# Patient Record
Sex: Male | Born: 1980 | Race: White | Hispanic: No | Marital: Married | Smoking: Current every day smoker
Health system: Southern US, Community
[De-identification: ages and names within clinical notes are randomized; demographics above are authoritative.]

## PROBLEM LIST (undated history)

## (undated) DIAGNOSIS — F32A Depression, unspecified: Secondary | ICD-10-CM

## (undated) DIAGNOSIS — F329 Major depressive disorder, single episode, unspecified: Secondary | ICD-10-CM

## (undated) HISTORY — DX: Depression, unspecified: F32.A

## (undated) HISTORY — DX: Major depressive disorder, single episode, unspecified: F32.9

## (undated) HISTORY — PX: BACK SURGERY: SHX140

---
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
## 2000-07-02 ENCOUNTER — Emergency Department (HOSPITAL_COMMUNITY): Admission: EM | Admit: 2000-07-02 | Discharge: 2000-07-02 | Payer: Self-pay | Admitting: Emergency Medicine

## 2001-08-08 ENCOUNTER — Emergency Department (HOSPITAL_COMMUNITY): Admission: EM | Admit: 2001-08-08 | Discharge: 2001-08-08 | Payer: Self-pay | Admitting: Emergency Medicine

## 2001-11-15 ENCOUNTER — Emergency Department (HOSPITAL_COMMUNITY): Admission: EM | Admit: 2001-11-15 | Discharge: 2001-11-15 | Payer: Self-pay | Admitting: Emergency Medicine

## 2002-10-27 ENCOUNTER — Emergency Department (HOSPITAL_COMMUNITY): Admission: EM | Admit: 2002-10-27 | Discharge: 2002-10-27 | Payer: Self-pay

## 2004-08-22 ENCOUNTER — Emergency Department (HOSPITAL_COMMUNITY): Admission: EM | Admit: 2004-08-22 | Discharge: 2004-08-22 | Payer: Self-pay | Admitting: Emergency Medicine

## 2005-03-31 ENCOUNTER — Emergency Department (HOSPITAL_COMMUNITY): Admission: EM | Admit: 2005-03-31 | Discharge: 2005-03-31 | Payer: Self-pay | Admitting: *Deleted

## 2006-08-14 ENCOUNTER — Emergency Department (HOSPITAL_COMMUNITY): Admission: EM | Admit: 2006-08-14 | Discharge: 2006-08-14 | Payer: Self-pay | Admitting: Family Medicine

## 2007-07-13 ENCOUNTER — Emergency Department (HOSPITAL_COMMUNITY): Admission: EM | Admit: 2007-07-13 | Discharge: 2007-07-13 | Payer: Self-pay | Admitting: Emergency Medicine

## 2008-03-21 ENCOUNTER — Emergency Department (HOSPITAL_COMMUNITY): Admission: EM | Admit: 2008-03-21 | Discharge: 2008-03-21 | Payer: Self-pay | Admitting: Emergency Medicine

## 2008-07-01 ENCOUNTER — Emergency Department (HOSPITAL_COMMUNITY): Admission: EM | Admit: 2008-07-01 | Discharge: 2008-07-01 | Payer: Self-pay | Admitting: Emergency Medicine

## 2008-07-14 ENCOUNTER — Emergency Department (HOSPITAL_COMMUNITY): Admission: EM | Admit: 2008-07-14 | Discharge: 2008-07-14 | Payer: Self-pay | Admitting: Emergency Medicine

## 2008-07-17 ENCOUNTER — Ambulatory Visit (HOSPITAL_COMMUNITY): Admission: RE | Admit: 2008-07-17 | Discharge: 2008-07-17 | Payer: Self-pay | Admitting: Orthopedic Surgery

## 2008-08-05 ENCOUNTER — Ambulatory Visit (HOSPITAL_COMMUNITY): Admission: RE | Admit: 2008-08-05 | Discharge: 2008-08-06 | Payer: Self-pay | Admitting: Orthopedic Surgery

## 2011-02-21 NOTE — Op Note (Signed)
NAME:  Dan, [name] NO.:  [id number]   MEDICAL RECORD NO.:  [id number]          PATIENT TYPE:  OIB   LOCATION:  3534                         FACILITY:  [facility]   PHYSICIAN:  Alvy Beal, MD    DATE OF BIRTH:  11/28/1980   DATE OF PROCEDURE:  08/05/2008  DATE OF DISCHARGE:                               OPERATIVE REPORT   PREOPERATIVE DIAGNOSIS:  Right L5-S1 disk herniation.   POSTOPERATIVE DIAGNOSIS:  Right L5-S1 disk herniation.   OPERATIVE PROCEDURE:  Right microlaminotomy for diskectomy.   INSTRUMENTATION USED:  NuVasive portal minimally invasive system.   INTRAOPERATIVE FINDINGS:  Significant L5-S1 disk herniation posterior  lateral to the right with cranial extension behind the body of L5  necessitating generous laminotomy of L5 on the right side.   HISTORY:  This is a very pleasant 30 year old gentleman who presents  with horrific right leg pain for the last 4-1/2 weeks.  MRI evaluation  and clinical exam confirmed S1 radiculopathy due to an L5-S1 disk  herniation.  Discussions were then taken on treatment options, he  elected to proceed with surgery.  All appropriate preoperative risks,  benefits, and alternatives were discussed as were alternatives to  surgery.   OPERATIVE NOTE:  The patient was brought to the operating room, placed  supine on the operating table.  After successful induction of general  anesthesia and endotracheal intubation, TED SCDs were applied, he was  turned prone onto the Wilson frame.  Arms were placed overhead and the  back was prepped and draped in standard fashion.  Small-bore guide pin  was advanced percutaneously about 2 cm to the right lateral side of  midline.  An x-ray fluoroscopy was used to confirm that it was actually  the L5-S1 disk space.  I then made a small 1-inch incision and then  advanced the dilating portal down till I could palpate the L5 spinous  process.  I then swept and wanded the L5 spinous process  down to the  lamina out the facet joint and then went cranial caudal.  Care was taken  not to plunge into the L5-S1 interbody space.  I then sequentially had  done it up and then placed a working portal.  I then opened the portals,  so that I had appropriate visualization.  I then secured it to the  table, placed the lighted wands down the side and I had excellent  visualization of the L5-S1 disk space.  I then confirmed that this was  the appropriate level with lateral fluoroscopy placing a Penfield 4  underneath the lamina of L5.  I then used a 3-mm Kerrison to perform a  laminotomy of L5.  I then used a micro fine curette to develop a plane  between the dura and overlying ligamentum flavum.  I then used a 3-mm  Kerrison to resect the L5-S1 lamina to expose the underlying thecal sac.  At this point, I was able to easily sweep the neural elements medially  and protect it with a D'Errico and there was a very large fragment of  the  disk herniation material.  I then used the annular plate to incise  the annulus and then using a combination of pituitary rongeurs and  curettes, I was able to remove multiple large fragments of disk  material.  I identified the L5-S1 disk space and gently probed in the  inter disk space with a micro pituitary rongeur removing any loose  fragments of material.  I then probed inferiorly and the superiorly.  Because of the superior migration of the disk fragment, I had performed  a generous laminotomy which allowed me to remove very large fragments of  herniated disk material.  Once I had all the herniated material out, I  irrigated with copious normal saline.  I then took a hockey stick (dural  elevator) and swept it superiorly, medially, inferiorly along the  lateral recess and out the neural foramen confirming an adequate  diskectomy and the nerve roots under no undue tension.  At this point  with the diskectomy complete, I then irrigated the wound copiously  with  normal saline, obtained hemostasis with bipolar electrocautery and using  bone wax on the laminotomy site and then FloSeal.  I then removed the  minimally invasive access portal and then used a 1-mm 0-Vicryl suture to  reapproximate the fascial incision.  I then closed superficial with 2-0  Vicryl sutures and 3-0 Monocryl for the skin.  Steri-Strips, dry  dressing were applied.  The patient was extubated and transferred to  PACU without incident.  At the end of the case, all needle and sponge  counts were correct.      Alvy Beal, MD  Electronically Signed     DDB/MEDQ  D:  08/05/2008  T:  08/06/2008  Job:  [id number]

## 2011-07-10 LAB — CBC
HCT: 39.3
WBC: 6.9

## 2015-11-01 ENCOUNTER — Emergency Department (HOSPITAL_BASED_OUTPATIENT_CLINIC_OR_DEPARTMENT_OTHER)
Admission: EM | Admit: 2015-11-01 | Discharge: 2015-11-01 | Disposition: A | Discharge status: Home / Self Care | Payer: BLUE CROSS/BLUE SHIELD | Attending: Emergency Medicine | Admitting: Emergency Medicine

## 2015-11-01 ENCOUNTER — Emergency Department (HOSPITAL_BASED_OUTPATIENT_CLINIC_OR_DEPARTMENT_OTHER): Payer: BLUE CROSS/BLUE SHIELD

## 2015-11-01 ENCOUNTER — Encounter (HOSPITAL_BASED_OUTPATIENT_CLINIC_OR_DEPARTMENT_OTHER): Payer: Self-pay

## 2015-11-01 DIAGNOSIS — M25551 Pain in right hip: Secondary | ICD-10-CM | POA: Diagnosis present

## 2015-11-01 DIAGNOSIS — F17200 Nicotine dependence, unspecified, uncomplicated: Secondary | ICD-10-CM | POA: Insufficient documentation

## 2015-11-01 MED ORDER — NAPROXEN 500 MG PO TABS: 500.0000 mg | ORAL_TABLET | Freq: Two times a day (BID) | ORAL | Status: DC

## 2015-11-01 MED ORDER — OXYCODONE-ACETAMINOPHEN 5-325 MG PO TABS: 1.0000 | ORAL_TABLET | Freq: Four times a day (QID) | ORAL | Status: DC | PRN

## 2015-11-01 MED ORDER — OXYCODONE-ACETAMINOPHEN 5-325 MG PO TABS
2.0000 | ORAL_TABLET | Freq: Once | ORAL | Status: AC
  Administered 2015-11-01: 2 via ORAL
  Filled 2015-11-01: qty 2

## 2015-11-01 NOTE — ED Provider Notes (Signed)
CSN: [id number]     Arrival date & time 11/01/15  1833 History  By signing my name below, I, Murriel Hopper, attest that this documentation has been prepared under the direction and in the presence of Arthor Captain, PA-C.  Electronically Signed: Murriel Hopper, ED Scribe. 11/01/2015. 9:12 PM.     Chief Complaint  Patient presents with  . Hip Pain      The history is provided by the patient. No language interpreter was used.   HPI Comments: Dan [name] is a 35 y.o. male who presents to the Emergency Department complaining of constant, worsening right hip pain that worsens with movement and ambulation that has been present for about a week. Pt denies any known injury, and states his pain began randomly. Pt denies taking any medications to treat his pain, denies having any other joints that are currently painful. Pt reports a hx of lower back surgery a few years ago, but denies any previous injury to the area. Pt denies abdominal pain and testicular pain.    History reviewed. No pertinent past medical history. Past Surgical History  Procedure Laterality Date  . Back surgery     No family history on file. Social History  Substance Use Topics  . Smoking status: Current Every Day Smoker  . Smokeless tobacco: None  . Alcohol Use: No    Review of Systems  Gastrointestinal: Negative for abdominal pain.  Genitourinary: Negative for testicular pain.  Musculoskeletal: Positive for arthralgias.   A complete 10 system review of systems was obtained and all systems are negative except as noted in the HPI and PMH.     Allergies  Review of patient's allergies indicates no known allergies.  Home Medications   Prior to Admission medications   Not on File   BP 147/86 mmHg  Pulse 62  Temp(Src) 98.1 F (36.7 C) (Oral)  Resp 16  Ht  (1.88 m)  Wt 165 lb (74.844 kg)  BMI 21.18 kg/m2  SpO2 100% Physical Exam  Constitutional: He is oriented to person, place, and time. He appears  well-developed and well-nourished.  HENT:  Head: Normocephalic and atraumatic.  Cardiovascular: Normal rate.   Pulmonary/Chest: Effort normal.  Abdominal: He exhibits no distension.  Musculoskeletal: He exhibits tenderness.  Pain with flexion movement of the hip Tender to palpation at the insertion of the hip flexors No bulging or signs of hernia    Neurological: He is alert and oriented to person, place, and time.  Skin: Skin is warm and dry.  Psychiatric: He has a normal mood and affect.  Nursing note and vitals reviewed.   ED Course  Procedures (including critical care time)  DIAGNOSTIC STUDIES: Oxygen Saturation is 100% on room air, normal by my interpretation.    COORDINATION OF CARE: 8:53 PM Discussed treatment plan with pt at bedside and pt agreed to plan.   Labs Review Labs Reviewed - No data to display  Imaging Review Dg Hip Unilat With Pelvis 2-3 Views Right  11/01/2015  CLINICAL DATA:  Right hip pain for 2 weeks. No known injury. Initial encounter. EXAM: DG HIP (WITH OR WITHOUT PELVIS) 2-3V RIGHT COMPARISON:  None. FINDINGS: There is no evidence of hip fracture or dislocation. There is no evidence of arthropathy or other focal bone abnormality. IMPRESSION: Normal exam. Electronically Signed   By: Drusilla Kanner M.D.   On: 11/01/2015 20:32   I have personally reviewed and evaluated these images and lab results as part of my medical decision-making.  EKG Interpretation None      MDM   Final diagnoses:  Hip pain, right  8:58 PM Filed Vitals:   11/01/15 1842  BP: 147/86  Pulse: 62  Temp: 98.1 F (36.7 C)  Resp: 16     Patient X-Ray negative for obvious fracture or dislocation. Pain managed in ED. Pt advised to follow up with orthopedics if symptoms persist for possibility of missed fracture diagnosis. Patient given brace while in ED, conservative therapy recommended and discussed. Patient will be dc home & is agreeable with above plan.   I  personally performed the services described in this documentation, which was scribed in my presence. The recorded information has been reviewed and is accurate.       Arthor Captain, PA-C 11/01/15 2323  Jerelyn Scott, MD 11/01/15 [phone]

## 2015-11-01 NOTE — ED Notes (Signed)
Right hip pain x 1 week-denies injury-steady gait

## 2015-11-01 NOTE — Discharge Instructions (Signed)

## 2016-05-01 ENCOUNTER — Emergency Department (HOSPITAL_COMMUNITY): Payer: BLUE CROSS/BLUE SHIELD

## 2016-05-01 ENCOUNTER — Emergency Department (HOSPITAL_COMMUNITY)
Admission: EM | Admit: 2016-05-01 | Discharge: 2016-05-01 | Disposition: A | Discharge status: Home / Self Care | Payer: BLUE CROSS/BLUE SHIELD | Source: Home / Self Care

## 2016-05-01 ENCOUNTER — Encounter (HOSPITAL_BASED_OUTPATIENT_CLINIC_OR_DEPARTMENT_OTHER): Payer: Self-pay | Admitting: *Deleted

## 2016-05-01 ENCOUNTER — Encounter (HOSPITAL_COMMUNITY): Payer: Self-pay | Admitting: Emergency Medicine

## 2016-05-01 ENCOUNTER — Emergency Department (HOSPITAL_BASED_OUTPATIENT_CLINIC_OR_DEPARTMENT_OTHER)
Admission: EM | Admit: 2016-05-01 | Discharge: 2016-05-01 | Disposition: A | Discharge status: Home / Self Care | Payer: BLUE CROSS/BLUE SHIELD | Attending: Emergency Medicine | Admitting: Emergency Medicine

## 2016-05-01 DIAGNOSIS — R42 Dizziness and giddiness: Secondary | ICD-10-CM | POA: Diagnosis not present

## 2016-05-01 DIAGNOSIS — Z5321 Procedure and treatment not carried out due to patient leaving prior to being seen by health care provider: Secondary | ICD-10-CM

## 2016-05-01 DIAGNOSIS — R51 Headache: Secondary | ICD-10-CM | POA: Diagnosis present

## 2016-05-01 DIAGNOSIS — G510 Bell's palsy: Secondary | ICD-10-CM | POA: Diagnosis not present

## 2016-05-01 DIAGNOSIS — R410 Disorientation, unspecified: Secondary | ICD-10-CM

## 2016-05-01 DIAGNOSIS — F17200 Nicotine dependence, unspecified, uncomplicated: Secondary | ICD-10-CM | POA: Insufficient documentation

## 2016-05-01 DIAGNOSIS — R29898 Other symptoms and signs involving the musculoskeletal system: Secondary | ICD-10-CM

## 2016-05-01 DIAGNOSIS — R110 Nausea: Secondary | ICD-10-CM | POA: Insufficient documentation

## 2016-05-01 DIAGNOSIS — F1290 Cannabis use, unspecified, uncomplicated: Secondary | ICD-10-CM | POA: Diagnosis not present

## 2016-05-01 DIAGNOSIS — G4489 Other headache syndrome: Secondary | ICD-10-CM

## 2016-05-01 DIAGNOSIS — R531 Weakness: Secondary | ICD-10-CM | POA: Insufficient documentation

## 2016-05-01 DIAGNOSIS — F17210 Nicotine dependence, cigarettes, uncomplicated: Secondary | ICD-10-CM | POA: Diagnosis not present

## 2016-05-01 DIAGNOSIS — R29810 Facial weakness: Secondary | ICD-10-CM

## 2016-05-01 LAB — CBC WITH DIFFERENTIAL/PLATELET
BASOS ABS: 0 10*3/uL (ref 0.0–0.1)
BASOS PCT: 0 %
EOS ABS: 0.1 10*3/uL (ref 0.0–0.7)
EOS PCT: 1 %
HCT: 40.7 % (ref 39.0–52.0)
Hemoglobin: 13.4 g/dL (ref 13.0–17.0)
Lymphocytes Relative: 24 %
Lymphs Abs: 2 10*3/uL (ref 0.7–4.0)
MCH: 28 pg (ref 26.0–34.0)
MCHC: 32.9 g/dL (ref 30.0–36.0)
MCV: 85 fL (ref 78.0–100.0)
Monocytes Absolute: 0.8 10*3/uL (ref 0.1–1.0)
Monocytes Relative: 10 %
Neutro Abs: 5.3 10*3/uL (ref 1.7–7.7)
Neutrophils Relative %: 65 %
PLATELETS: 207 10*3/uL (ref 150–400)
RBC: 4.79 MIL/uL (ref 4.22–5.81)
RDW: 13.7 % (ref 11.5–15.5)
WBC: 8.1 10*3/uL (ref 4.0–10.5)

## 2016-05-01 LAB — COMPREHENSIVE METABOLIC PANEL
ALT: 14 U/L — AB (ref 17–63)
AST: 20 U/L (ref 15–41)
Albumin: 4.2 g/dL (ref 3.5–5.0)
Alkaline Phosphatase: 65 U/L (ref 38–126)
Anion gap: 7 (ref 5–15)
BUN: 8 mg/dL (ref 6–20)
CHLORIDE: 106 mmol/L (ref 101–111)
CO2: 25 mmol/L (ref 22–32)
CREATININE: 1.09 mg/dL (ref 0.61–1.24)
Calcium: 9.2 mg/dL (ref 8.9–10.3)
GFR calc Af Amer: >60 mL/min (ref >60)
GFR calc non Af Amer: >60 mL/min (ref >60)
Glucose, Bld: 104 mg/dL — ABNORMAL HIGH (ref 65–99)
Potassium: 3.7 mmol/L (ref 3.5–5.1)
SODIUM: 138 mmol/L (ref 135–145)
Total Bilirubin: 1.2 mg/dL (ref 0.3–1.2)
Total Protein: 6.9 g/dL (ref 6.5–8.1)

## 2016-05-01 LAB — RAPID URINE DRUG SCREEN, HOSP PERFORMED
AMPHETAMINES: NOT DETECTED
BARBITURATES: NOT DETECTED
Benzodiazepines: NOT DETECTED
COCAINE: NOT DETECTED
Opiates: NOT DETECTED
TETRAHYDROCANNABINOL: POSITIVE — AB

## 2016-05-01 LAB — URINALYSIS, ROUTINE W REFLEX MICROSCOPIC
Bilirubin Urine: NEGATIVE
GLUCOSE, UA: NEGATIVE mg/dL
HGB URINE DIPSTICK: NEGATIVE
KETONES UR: 40 mg/dL — AB
LEUKOCYTES UA: NEGATIVE
NITRITE: NEGATIVE
PROTEIN: NEGATIVE mg/dL
Specific Gravity, Urine: 1.029 (ref 1.005–1.030)
pH: 6 (ref 5.0–8.0)

## 2016-05-01 LAB — ETHANOL

## 2016-05-01 MED ORDER — MORPHINE SULFATE (PF) 4 MG/ML IV SOLN
4.0000 mg | Freq: Once | INTRAVENOUS | Status: AC
  Administered 2016-05-01: 4 mg via INTRAMUSCULAR

## 2016-05-01 MED ORDER — PREDNISONE 50 MG PO TABS: ORAL_TABLET | ORAL | 0 refills | Status: DC

## 2016-05-01 MED ORDER — ARTIFICIAL TEARS OP OINT: TOPICAL_OINTMENT | OPHTHALMIC | 0 refills | Status: AC | PRN

## 2016-05-01 MED ORDER — MORPHINE SULFATE (PF) 4 MG/ML IV SOLN
4.0000 mg | Freq: Once | INTRAVENOUS | Status: DC
  Filled 2016-05-01: qty 1

## 2016-05-01 MED ORDER — OXYCODONE-ACETAMINOPHEN 5-325 MG PO TABS
2.0000 | ORAL_TABLET | Freq: Once | ORAL | Status: AC
  Administered 2016-05-01: 2 via ORAL
  Filled 2016-05-01: qty 2

## 2016-05-01 NOTE — ED Triage Notes (Addendum)
Pt c/o h/a slurred speech , unsteady gate x 4 days. Seen at [facility] ED today blood work and EKG done  and LWBS

## 2016-05-01 NOTE — ED Triage Notes (Signed)
Took Tylenol and Ibuprofen no help

## 2016-05-01 NOTE — ED Triage Notes (Signed)
Pt. Stated, I started having a headache yesterday with nausea no other symptoms right now.

## 2016-05-01 NOTE — ED Notes (Signed)
Patient aware of need for urine specimen.  Urinal at bedside 

## 2016-05-01 NOTE — ED Notes (Addendum)
This nurse not into see patient  Wants to see a doctor before any further care rendered

## 2016-05-01 NOTE — ED Notes (Addendum)
Patient complains of headache that started on Friday.  Denies injury. Complains of whole body numbness and left arm weakness.  Unable to make a complete fist.  Reports pain is only on left side of head.  Reports photophobia.  Reports legs are always weak since back surgery.

## 2016-05-01 NOTE — ED Notes (Signed)
Patient ambulatory with steady gait to room.

## 2016-05-01 NOTE — ED Notes (Addendum)
Pt refused any care or to be changed into hospital gown without seeing a doctor first to give him his test results.

## 2016-05-01 NOTE — ED Notes (Signed)
Entered room to speak with patient. Per EMT Chasity, patient is upset about wait time for results. MD Wickline informed of patient complaint and request for evaluation. Patient difficult to de-escalate and he continued to raise voice in spite of attempts to do so. Patient states "I am leaving in 15 minutes".

## 2016-05-01 NOTE — ED Notes (Signed)
Called in waiting room without reply. Called MRI sts do not have patient

## 2016-05-01 NOTE — ED Provider Notes (Signed)
[facility]   CSN: [id number] Arrival date & time: 05/01/16  1425  First Provider Contact:  First MD Initiated Contact with Patient 05/01/16 1525        History   Chief Complaint Chief Complaint  Patient presents with  . Altered Mental Status    HPI Jabri Sturdevant is a 35 y.o. male.  HPI Durel Timian is a 35 y.o. male with No significant past medical history who presents with gradual onset, constant, severe left-sided headache that began 3 days ago. He states he took ibuprofen and Tylenol without relief. He states last night around 7 or 8 PM he began experiencing left-sided facial numbness and left upper extremity weakness along with difficulty ambulating due to dizziness. He also reports slurred speech. No visual disturbance, neck pain, or neck stiffness. He denies any head trauma or injury. Denies cocaine use. He states he does occasionally smoke marijuana. He denies alcohol use. He smokes about half pack of cigarettes a day. He denies any other past medical history or chronic medications. No aggravating factors. No modifying factors.  History reviewed. No pertinent past medical history.  There are no active problems to display for this patient.   Past Surgical History:  Procedure Laterality Date  . BACK SURGERY         Home Medications    Prior to Admission medications   Medication Sig Start Date End Date Taking? Authorizing Provider  naproxen (NAPROSYN) 500 MG tablet Take 1 tablet (500 mg total) by mouth 2 (two) times daily with a meal. 11/01/15   Arthor Captain, PA-C  oxyCODONE-acetaminophen (PERCOCET/ROXICET) 5-325 MG tablet Take 1-2 tablets by mouth every 6 (six) hours as needed for severe pain. 11/01/15   Arthor Captain, PA-C    Family History History reviewed. No pertinent family history.  Social History Social History  Substance Use Topics  . Smoking status: Current Every Day Smoker  . Smokeless tobacco: Current User  . Alcohol use  Yes     Allergies   Review of patient's allergies indicates no known allergies.   Review of Systems Review of Systems All other systems negative unless otherwise stated in HPI    Physical Exam Updated Vital Signs BP 116/75 (BP Location: Right Arm)   Pulse 64   Temp 98.5 F (36.9 C) (Oral)   Resp 18   SpO2 100%   Physical Exam  Constitutional: He is oriented to person, place, and time. He appears well-developed and well-nourished.  Non-toxic appearance. He does not have a sickly appearance. He does not appear ill.  HENT:  Head: Normocephalic and atraumatic.  Mouth/Throat: Oropharynx is clear and moist.  Eyes: Conjunctivae and EOM are normal. Pupils are equal, round, and reactive to light.  Neck: Normal range of motion. Neck supple.  Cardiovascular: Normal rate and regular rhythm.   Pulmonary/Chest: Effort normal and breath sounds normal. No accessory muscle usage or stridor. No respiratory distress. He has no wheezes. He has no rhonchi. He has no rales.  Abdominal: Soft. Bowel sounds are normal. He exhibits no distension. There is no tenderness.  Musculoskeletal: Normal range of motion.  Lymphadenopathy:    He has no cervical adenopathy.  Neurological: He is alert and oriented to person, place, and time.  Mental Status:   AOx3.  Slightly slurred speech.  Cranial Nerves:  I-not tested  II-PERRLA  III, IV, VI-EOMs intact  V-temporal and masseter strength intact.  Patient reports altered sensation to light touch on the left.   VII-Abnormal  facial movements.  Left facial droop. VIII-hearing grossly intact bilaterally  IX, X-gag intact  XI-strength of sternomastoid and trapezius muscles 5/5 on the right 3/5 on the left.   XII-tongue midline Motor:   Good muscle bulk and tone  Strength 3/5 on the LUE; otherwise, 5/5  Cerebellar--intact RAMs, finger to nose intact; however dysmetria bilaterally.  Gait slightly unsteady.   No pronator drift Sensory:  Intact in upper and  lower extremities   Skin: Skin is warm and dry.  Psychiatric: He has a normal mood and affect. His behavior is normal.     ED Treatments / Results  Labs (all labs ordered are listed, but only abnormal results are displayed) Labs Reviewed  URINE RAPID DRUG SCREEN, HOSP PERFORMED - Abnormal; Notable for the following:       Result Value   Tetrahydrocannabinol POSITIVE (*)    All other components within normal limits  URINALYSIS, ROUTINE W REFLEX MICROSCOPIC (NOT AT [facility]) - Abnormal; Notable for the following:    Ketones, ur 40 (*)    All other components within normal limits  ETHANOL    EKG  EKG Interpretation None      ED ECG REPORT   Date: 05/01/2016  Rate: 73   Rhythm: normal sinus rhythm  QRS Axis: normal  Intervals: normal  ST/T Wave abnormalities: early repolarization  Conduction Disutrbances:none  Narrative Interpretation:   Old EKG Reviewed: none available  I have personally reviewed the EKG tracing and agree with the computerized printout as noted.  Radiology No results found.  Procedures Procedures (including critical care time)  Medications Ordered in ED Medications  morphine 4 MG/ML injection 4 mg (4 mg Intramuscular Given 05/01/16 1618)     Initial Impression / Assessment and Plan / ED Course  I have reviewed the triage vital signs and the nursing notes.  Pertinent labs & imaging results that were available during my care of the patient were reviewed by me and considered in my medical decision making (see chart for details).  Clinical Course  Patient presents with HA, facial paresthesias, LUE weakness, and slurred speech that began approximately 7-8 last night (20 hours ago).  Patient with left facial droop and LUE weakness on exam.  Patient had lab work done at [facility], CBC, BMP, and CBG all appear normal.  Will obtain UDS, ethanol, and EKG here. Patient will be seen by Dr. Donnald Garre. UDS positive for THC. Will order MRI and MRA of head. Concern for  CVA. Spoke with neurology, Dr. Hilda Blades, who will see the patient upon arrival.  [facility], Dr. Fredderick Phenix aware and accepts patient for transfer. Patient is hemodynamically stable at this time. Patient refuses transport via ambulance, [facility] unavailable at this time. He reports he will transport via private vehicle. His wife who will take him.  Final Clinical Impressions(s) / ED Diagnoses   Final diagnoses:  Weakness of left upper extremity  Facial droop    New Prescriptions New Prescriptions   No medications on file     Cheri Fowler, PA-C 05/01/16 1656    Cheri Fowler, PA-C 05/01/16 1657    Arby Barrette, MD 05/02/16 [phone]

## 2016-05-01 NOTE — ED Notes (Addendum)
Patient instructed to go immediately to [facility] ER.  Spouse driving patient to ER.  Patient ambulatory upon leaving ER-refuses wheelchair.

## 2016-05-01 NOTE — ED Provider Notes (Signed)
I assumed care of patient after transfer from [facility] He was sent for MRI MRI brain negative On my evaluation, pt has left sided facial droop and difficulty closing left eye He has no arm or leg weakness He is ambulatory without ataxia In light of negative MRI and my clinical findings, this appears c/w bell palsy Will start on prednisone Start artificial tears and discussed importance of eye care Pt requesting d/c home immediately Per previous note he was supposed to see neuro but pt requested d/c   Zadie Rhine, MD 05/01/16 2352

## 2016-05-01 NOTE — ED Notes (Signed)
Spoke with Irving Burton, RN Charge about patient.  Patient has arrived there and they are attempting to place him in a room to wait for MRI

## 2016-05-10 ENCOUNTER — Ambulatory Visit (INDEPENDENT_AMBULATORY_CARE_PROVIDER_SITE_OTHER): Payer: BLUE CROSS/BLUE SHIELD | Admitting: Neurology

## 2016-05-10 ENCOUNTER — Encounter: Payer: Self-pay | Admitting: Neurology

## 2016-05-10 VITALS — BP 134/89 | HR 61 | Ht 74.0 in | Wt 154.0 lb

## 2016-05-10 DIAGNOSIS — H9202 Otalgia, left ear: Secondary | ICD-10-CM

## 2016-05-10 DIAGNOSIS — G510 Bell's palsy: Secondary | ICD-10-CM

## 2016-05-10 DIAGNOSIS — R42 Dizziness and giddiness: Secondary | ICD-10-CM | POA: Diagnosis not present

## 2016-05-10 DIAGNOSIS — G43009 Migraine without aura, not intractable, without status migrainosus: Secondary | ICD-10-CM

## 2016-05-10 DIAGNOSIS — H9212 Otorrhea, left ear: Secondary | ICD-10-CM | POA: Diagnosis not present

## 2016-05-10 DIAGNOSIS — R110 Nausea: Secondary | ICD-10-CM | POA: Diagnosis not present

## 2016-05-10 MED ORDER — MECLIZINE HCL 25 MG PO TABS: 25.0000 mg | ORAL_TABLET | Freq: Three times a day (TID) | ORAL | 1 refills | Status: AC | PRN

## 2016-05-10 MED ORDER — ONDANSETRON 4 MG PO TBDP
4.0000 mg | ORAL_TABLET | Freq: Three times a day (TID) | ORAL | 1 refills | Status: AC | PRN
Start: 2016-05-10 — End: ?

## 2016-05-10 NOTE — Patient Instructions (Addendum)
Remember to drink plenty of fluid, eat healthy meals and do not skip any meals. Try to eat protein with a every meal and eat a healthy snack such as fruit or nuts in between meals. Try to keep a regular sleep-wake schedule and try to exercise daily, particularly in the form of walking, 20-30 minutes a day, if you can.   As far as your medications are concerned, I would like to suggest:  Meclizine and zofran prn as prescribed  As far as diagnostic testing:   Need follow up with primary care within a week  I would like to see you back if needed in 3 months, call back if needed, sooner if we need to. Please call us with any interim questions, concerns, problems, updates or refill requests.   Would not recommend climbing ladders for the next 3 days if dizzy or performing any activities that may put you or others in danger should you feel dizzy.  Our phone number is [phone]. We also have an after hours call service for urgent matters and there is a physician on-call for urgent questions. For any emergencies you know to call 911 or go to the nearest emergency room  Ondansetron oral dissolving tablet What is this medicine? ONDANSETRON (on DAN se tron) is used to treat nausea and vomiting caused by chemotherapy. It is also used to prevent or treat nausea and vomiting after surgery. This medicine may be used for other purposes; ask your health care provider or pharmacist if you have questions. What should I tell my health care provider before I take this medicine? They need to know if you have any of these conditions: -heart disease -history of irregular heartbeat -liver disease -low levels of magnesium or potassium in the blood -an unusual or allergic reaction to ondansetron, granisetron, other medicines, foods, dyes, or preservatives -pregnant or trying to get pregnant -breast-feeding How should I use this medicine? These tablets are made to dissolve in the mouth. Do not try to push the  tablet through the foil backing. With dry hands, peel away the foil backing and gently remove the tablet. Place the tablet in the mouth and allow it to dissolve, then swallow. While you may take these tablets with water, it is not necessary to do so. Talk to your pediatrician regarding the use of this medicine in children. Special care may be needed. Overdosage: If you think you have taken too much of this medicine contact a poison control center or emergency room at once. NOTE: This medicine is only for you. Do not share this medicine with others. What if I miss a dose? If you miss a dose, take it as soon as you can. If it is almost time for your next dose, take only that dose. Do not take double or extra doses. What may interact with this medicine? Do not take this medicine with any of the following medications: -apomorphine -certain medicines for fungal infections like fluconazole, itraconazole, ketoconazole, posaconazole, voriconazole -cisapride -dofetilide -dronedarone -pimozide -thioridazine -ziprasidone This medicine may also interact with the following medications: -carbamazepine -certain medicines for depression, anxiety, or psychotic disturbances -fentanyl -linezolid -MAOIs like Carbex, Eldepryl, Marplan, Nardil, and Parnate -methylene blue (injected into a vein) -other medicines that prolong the QT interval (cause an abnormal heart rhythm) -phenytoin -rifampicin -tramadol This list may not describe all possible interactions. Give your health care provider a list of all the medicines, herbs, non-prescription drugs, or dietary supplements you use. Also tell them if you smoke, drink  alcohol, or use illegal drugs. Some items may interact with your medicine. What should I watch for while using this medicine? Check with your doctor or health care professional as soon as you can if you have any sign of an allergic reaction. What side effects may I notice from receiving this  medicine? Side effects that you should report to your doctor or health care professional as soon as possible: -allergic reactions like skin rash, itching or hives, swelling of the face, lips, or tongue -breathing problems -confusion -dizziness -fast or irregular heartbeat -feeling faint or lightheaded, falls -fever and chills -loss of balance or coordination -seizures -sweating -swelling of the hands and feet -tightness in the chest -tremors -unusually weak or tired Side effects that usually do not require medical attention (report to your doctor or health care professional if they continue or are bothersome): -constipation or diarrhea -headache This list may not describe all possible side effects. Call your doctor for medical advice about side effects. You may report side effects to FDA at 1-800-FDA-1088. Where should I keep my medicine? Keep out of the reach of children. Store between 2 and 30 degrees C (36 and 86 degrees F). Throw away any unused medicine after the expiration date. NOTE: This sheet is a summary. It may not cover all possible information. If you have questions about this medicine, talk to your doctor, pharmacist, or health care provider.    2016, Elsevier/Gold Standard. (2013-07-02 16:21:52)   Meclizine tablets or capsules What is this medicine? MECLIZINE (MEK li zeen) is an antihistamine. It is used to prevent nausea, vomiting, or dizziness caused by motion sickness. It is also used to prevent and treat vertigo (extreme dizziness or a feeling that you or your surroundings are tilting or spinning around). This medicine may be used for other purposes; ask your health care provider or pharmacist if you have questions. What should I tell my health care provider before I take this medicine? They need to know if you have any of these conditions: -glaucoma -lung or breathing disease, like asthma -problems urinating -prostate disease -stomach or intestine  problems -an unusual or allergic reaction to meclizine, other medicines, foods, dyes, or preservatives -pregnant or trying to get pregnant -breast-feeding How should I use this medicine? Take this medicine by mouth with a glass of water. Follow the directions on the prescription label. If you are using this medicine to prevent motion sickness, take the dose at least 1 hour before travel. If it upsets your stomach, take it with food or milk. Take your doses at regular intervals. Do not take your medicine more often than directed. Talk to your pediatrician regarding the use of this medicine in children. Special care may be needed. Overdosage: If you think you have taken too much of this medicine contact a poison control center or emergency room at once. NOTE: This medicine is only for you. Do not share this medicine with others. What if I miss a dose? If you miss a dose, take it as soon as you can. If it is almost time for your next dose, take only that dose. Do not take double or extra doses. What may interact with this medicine? Do not take this medicine with any of the following medications: -MAOIs like Carbex, Eldepryl, Marplan, Nardil, and Parnate This medicine may also interact with the following medications: -alcohol -antihistamines for allergy, cough and cold -certain medicines for anxiety or sleep -certain medicines for depression, like amitriptyline, fluoxetine, sertraline -certain medicines for seizures  like phenobarbital, primidone -general anesthetics like halothane, isoflurane, methoxyflurane, propofol -local anesthetics like lidocaine, pramoxine, tetracaine -medicines that relax muscles for surgery -narcotic medicines for pain -phenothiazines like chlorpromazine, mesoridazine, prochlorperazine, thioridazine This list may not describe all possible interactions. Give your health care provider a list of all the medicines, herbs, non-prescription drugs, or dietary supplements you  use. Also tell them if you smoke, drink alcohol, or use illegal drugs. Some items may interact with your medicine. What should I watch for while using this medicine? Tell your doctor or healthcare professional if your symptoms do not start to get better or if they get worse. You may get drowsy or dizzy. Do not drive, use machinery, or do anything that needs mental alertness until you know how this medicine affects you. Do not stand or sit up quickly, especially if you are an older patient. This reduces the risk of dizzy or fainting spells. Alcohol may interfere with the effect of this medicine. Avoid alcoholic drinks. Your mouth may get dry. Chewing sugarless gum or sucking hard candy, and drinking plenty of water may help. Contact your doctor if the problem does not go away or is severe. This medicine may cause dry eyes and blurred vision. If you wear contact lenses you may feel some discomfort. Lubricating drops may help. See your eye doctor if the problem does not go away or is severe. What side effects may I notice from receiving this medicine? Side effects that you should report to your doctor or health care professional as soon as possible: -feeling faint or lightheaded, falls -fast, irregular heartbeat Side effects that usually do not require medical attention (report these to your doctor or health care professional if they continue or are bothersome): -constipation -headache -trouble passing urine or change in the amount of urine -trouble sleeping -upset stomach This list may not describe all possible side effects. Call your doctor for medical advice about side effects. You may report side effects to FDA at 1-800-FDA-1088. Where should I keep my medicine? Keep out of the reach of children. Store at room temperature between 15 and 30 degrees C (59 and 86 degrees F). Keep container tightly closed. Throw away any unused medicine after the expiration date. NOTE: This sheet is a summary. It  may not cover all possible information. If you have questions about this medicine, talk to your doctor, pharmacist, or health care provider.    2016, Elsevier/Gold Standard. (2015-04-01 09:20:57)

## 2016-05-10 NOTE — Progress Notes (Signed)
[facility]    Provider:  Dr Lucia Gaskins Referring Provider: Emergency room Primary Care Physician:  Marletta Lor, NP  CC:  Bells palsy, migraines and dizziness  HPI:  Dan [name] is a 35 y.o. male here as a referral from Dr. Teola Bradley for bell's palsy and migraines. PMHx of migraines and depression. Symptoms started Saturday night, he started feeling bad, started feeling droopy on his face. Wife is here who provides a lot of information. He had bad taste, metal in his mouth. Droopiness of the face, difficulty closing the eye. Improved slightly. Dizziness started the same time with imbalance. He is drinking plenty of fluids. He has a history of headaches. He had a migraine last night. Headaches are on the left side of the head, pounding, throbbing, weakness. He feels a little ear pain, he had some black heads on the left ear and he tried to bust them and there is some blood. He has lymph on the left. He had a blister on the lip previous to the symptoms. No FHx of neuropathy.  Reviewed notes, labs and imaging from outside physicians, which showed:  Patuent was seen in the ED 05/01/2016. On exam by ED physician, patient presented with left-sided headache, left sided facial droop and difficulty closing left eye, He had no arm or leg weakness, He was ambulatory without ataxia, Negative MRI and clinical findings  c/w bell palsy, They started on prednisone, Started  artificial tears and discussed importance of eye care. He was referred to neurology.   Personally reviewed images and agree with the following:  No evidence for acute infarction, hemorrhage, mass lesion, hydrocephalus, or extra-axial fluid. Normal cerebral volume. No white matter disease. Pituitary, pineal, and cerebellar tonsils unremarkable. No upper cervical lesions.Flow voids are maintained throughout the carotid, basilar, and vertebral arteries. There are no areas of chronic hemorrhage. Visualized calvarium, skull base, and  upper cervical osseous structures unremarkable. Scalp and extracranial soft tissues, orbits, sinuses, and mastoids show no acute process.  MRA HEAD FINDINGS The internal carotid arteries are symmetric and widely patent. The basilar artery is widely patent with both vertebrals contributing, LEFT dominant. No intracranial stenosis or aneurysm. IMPRESSION: Negative MRI and MRA of the brain. No acute or focal intracranial abnormality.  Labs: ethanol negative, rapid drug screen +THC, CMP and CBC unremarkable.   Review of Systems: Patient complains of symptoms per HPI as well as the following symptoms: Fevers chills, weight loss, fatigue, blurred vision, eye pain, lymph nodes, chest pain, shortness of breath, feeling cold, cramps, aching muscles, skin sensitivity, confusion, headache, numbness, weakness, slurred speech, dizziness, tremor, sleepiness, restless legs, depression, not enough sleep, decreased energy, change in appetite, disinterest and activities, racing thoughts . Pertinent negatives per HPI. All others negative.   Social History   Social History  . Marital status: Married    Spouse name: Leta  . Number of children: 2  . Years of education: 11   Occupational History  . [employer]    Social History Main Topics  . Smoking status: Current Every Day Smoker    Packs/day: 0.50  . Smokeless tobacco: Current User  . Alcohol use Yes     Comment: rare  . Drug use:     Types: Marijuana     Comment: once per week  . Sexual activity: Not on file   Other Topics Concern  . Not on file   Social History Narrative   Lives with wife   Caffeine use: Coffee- 2 cups every morning  Soda- 1 can per day   Tea- ocass    Family History  Problem Relation Age of Onset  . Diabetes Maternal Grandmother   . COPD Maternal Grandmother     Past Medical History:  Diagnosis Date  . Depression     Past Surgical History:  Procedure Laterality Date  . BACK SURGERY      Current  Outpatient Prescriptions  Medication Sig Dispense Refill  . acetaminophen (TYLENOL) 500 MG tablet Take 500 mg by mouth every 6 (six) hours as needed.    Marland Kitchen artificial tears (LACRILUBE) OINT ophthalmic ointment Place into the left eye every 4 (four) hours as needed for dry eyes. 3.5 g 0  . ibuprofen (ADVIL,MOTRIN) 200 MG tablet Take 400 mg by mouth every 6 (six) hours as needed.    . meclizine (ANTIVERT) 25 MG tablet Take 1 tablet (25 mg total) by mouth 3 (three) times daily as needed for dizziness. 30 tablet 1  . ondansetron (ZOFRAN ODT) 4 MG disintegrating tablet Take 1 tablet (4 mg total) by mouth every 8 (eight) hours as needed for nausea or vomiting. 30 tablet 1   No current facility-administered medications for this visit.     Allergies as of 05/10/2016  . (No Known Allergies)    Vitals: BP 134/89   Pulse 61   Ht 6\' 2"  (1.88 m)   Wt 154 lb (69.9 kg)   BMI 19.77 kg/m  Last Weight:  Wt Readings from Last 1 Encounters:  05/10/16 154 lb (69.9 kg)   Last Height:   Ht Readings from Last 1 Encounters:  05/10/16 6\' 2"  (1.88 m)   Physical exam: Exam: Gen: Poorly groomed, smells of cigarrette smoke                   CV: RRR, no MRG. No Carotid Bruits. No peripheral edema, warm, nontender Eyes: Conjunctivae clear without exudates or hemorrhage  Neuro: Detailed Neurologic Exam  Speech:    Speech is normal; fluent and spontaneous with normal comprehension.  Cognition:    The patient is oriented to person, place, and time;     recent and remote memory intact;     language fluent;     normal attention, concentration,     fund of knowledge Cranial Nerves:    The pupils are equal, round, and reactive to light. Attempted funduscopic exam could not visualize due to patient participation. Visual fields are full to finger confrontation. Extraocular movements are intact. Trigeminal sensation is intact and the muscles of mastication are normal. Left lower facial weakness, weakness of  left eye closure, weakness of eyebrow elevation on the left. The palate elevates in the midline. Hearing intact. Voice is normal. Shoulder shrug is normal. The tongue has normal motion without fasciculations.   Coordination:    No dysmetria  Gait:    Heel-toe and tandem gait are intact but patient staggers, seems exagerated  Motor Observation:    No asymmetry, no atrophy, and no involuntary movements noted. Tone:    Normal muscle tone.    Posture:    Posture is normal. normal erect    Strength: poor effort but strength is V/V in the upper and lower limbs.      Sensation: intact to LT     Reflex Exam:  DTR's:    Deep tendon reflexes in the upper and lower extremities are normal bilaterally.   Toes:    The toes are downgoing bilaterally.   Clonus:    Clonus is  absent.       Assessment/Plan:  35 year old with Bell's palsy, left ear pain, reported lymph nodes and ear drainage, headache and dizziness.   Poor effort on exam and possible exageration, no bells phenomenon on eye closure which would be expected unless patient not making an effort to close eyes tight. He does have Bell's Palsy but feel poor effort and some exaggeration. Will check for lyme and HIV as these can be causes of bell's palsy Steroid taper completed. No evidence for starting anti-virals at this point as symptoms started over 10 days ago ENT eval of left ear, reports ear pain and drainage and lymph nodes, could not visualize TMs due to wax, refer to Dr. Haroldine Laws Discussed artificial tears and importance of eye care No follow up needed Meclizine and zofran prn for his dizziness, nausea and headaches   Naomie Dean, MD  [facility] [address] [place], [place] [address]  Phone [phone] Fax [phone]

## 2016-05-11 LAB — B. BURGDORFI ANTIBODIES: Lyme IgG/IgM Ab: <0.91 {ISR} (ref 0.00–0.90)

## 2016-05-11 LAB — HIV ANTIBODY (ROUTINE TESTING W REFLEX): HIV Screen 4th Generation wRfx: NONREACTIVE

## 2016-05-12 ENCOUNTER — Encounter: Payer: Self-pay | Admitting: Neurology

## 2016-05-12 ENCOUNTER — Telehealth: Payer: Self-pay | Admitting: *Deleted

## 2016-05-12 DIAGNOSIS — G510 Bell's palsy: Secondary | ICD-10-CM | POA: Insufficient documentation

## 2016-05-12 DIAGNOSIS — G43909 Migraine, unspecified, not intractable, without status migrainosus: Secondary | ICD-10-CM | POA: Insufficient documentation

## 2016-05-12 NOTE — Telephone Encounter (Signed)
Pt returned call. Per nurse I told pt his labs were normal. He expressed understanding. No questions.

## 2016-05-12 NOTE — Telephone Encounter (Signed)
Tried calling mobile number and automated message says it has been disconnected. Tried home number and automated message states VM not set up, unable to LVM.   Tried work number. LVM for pt to call about lab results. Gave [facility] phone number. Ok to inform pt labs ok per Dr Lucia Gaskins.   Tried Wife's number listed on DPR form. LVM about lab results per Dr Lucia Gaskins. Advised they were normal. Gave [facility] phone number if they have any other questions.

## 2016-05-12 NOTE — Telephone Encounter (Signed)
-----   Message from Anson Fret, MD sent at 05/11/2016  5:22 PM EDT ----- Labs normal

## 2016-12-18 ENCOUNTER — Encounter (HOSPITAL_COMMUNITY): Payer: Self-pay | Admitting: *Deleted

## 2016-12-18 ENCOUNTER — Emergency Department (HOSPITAL_COMMUNITY): Payer: BLUE CROSS/BLUE SHIELD

## 2016-12-18 ENCOUNTER — Emergency Department (HOSPITAL_COMMUNITY)
Admission: EM | Admit: 2016-12-18 | Discharge: 2016-12-18 | Disposition: A | Discharge status: Home / Self Care | Payer: BLUE CROSS/BLUE SHIELD | Attending: Emergency Medicine | Admitting: Emergency Medicine

## 2016-12-18 DIAGNOSIS — F17200 Nicotine dependence, unspecified, uncomplicated: Secondary | ICD-10-CM | POA: Insufficient documentation

## 2016-12-18 DIAGNOSIS — L03221 Cellulitis of neck: Secondary | ICD-10-CM | POA: Diagnosis not present

## 2016-12-18 LAB — CBC
HEMATOCRIT: 40 % (ref 39.0–52.0)
Hemoglobin: 13.4 g/dL (ref 13.0–17.0)
MCH: 28.4 pg (ref 26.0–34.0)
MCHC: 33.5 g/dL (ref 30.0–36.0)
MCV: 84.7 fL (ref 78.0–100.0)
Platelets: 188 10*3/uL (ref 150–400)
RBC: 4.72 MIL/uL (ref 4.22–5.81)
RDW: 13.9 % (ref 11.5–15.5)
WBC: 15 10*3/uL — AB (ref 4.0–10.5)

## 2016-12-18 LAB — BASIC METABOLIC PANEL
ANION GAP: 6 (ref 5–15)
BUN: 15 mg/dL (ref 6–20)
CALCIUM: 9.5 mg/dL (ref 8.9–10.3)
CO2: 24 mmol/L (ref 22–32)
Chloride: 106 mmol/L (ref 101–111)
Creatinine, Ser: 1.1 mg/dL (ref 0.61–1.24)
Glucose, Bld: 116 mg/dL — ABNORMAL HIGH (ref 65–99)
POTASSIUM: 3.9 mmol/L (ref 3.5–5.1)
Sodium: 136 mmol/L (ref 135–145)

## 2016-12-18 MED ORDER — IOPAMIDOL (ISOVUE-300) INJECTION 61%
INTRAVENOUS | Status: AC
  Filled 2016-12-18: qty 75

## 2016-12-18 MED ORDER — MORPHINE SULFATE (PF) 4 MG/ML IV SOLN
4.0000 mg | Freq: Once | INTRAVENOUS | Status: AC
  Administered 2016-12-18: 4 mg via INTRAVENOUS
  Filled 2016-12-18: qty 1

## 2016-12-18 MED ORDER — OXYCODONE-ACETAMINOPHEN 5-325 MG PO TABS: 1.0000 | ORAL_TABLET | Freq: Four times a day (QID) | ORAL | 0 refills | Status: AC | PRN

## 2016-12-18 MED ORDER — IOPAMIDOL (ISOVUE-300) INJECTION 61%
75.0000 mL | Freq: Once | INTRAVENOUS | Status: AC | PRN
  Administered 2016-12-18: 75 mL via INTRAVENOUS

## 2016-12-18 MED ORDER — CLINDAMYCIN PHOSPHATE 600 MG/50ML IV SOLN
600.0000 mg | Freq: Once | INTRAVENOUS | Status: AC
  Administered 2016-12-18: 600 mg via INTRAVENOUS
  Filled 2016-12-18: qty 50

## 2016-12-18 MED ORDER — CLINDAMYCIN HCL 300 MG PO CAPS: 300.0000 mg | ORAL_CAPSULE | Freq: Three times a day (TID) | ORAL | 0 refills | Status: AC

## 2016-12-18 NOTE — ED Provider Notes (Signed)
[facility]-EMERGENCY DEPT Provider Note   CSN: [id number] Arrival date & time: 12/18/16  [id number]  By signing my name below, I, Freida Busmaniana Omoyeni, attest that this documentation has been prepared under the direction and in the presence of Audry Piliyler Kinda Pottle, PA-C. Electronically Signed: Freida Busmaniana Omoyeni, Scribe. 12/18/2016. 10:33 AM.  History   Chief Complaint Chief Complaint  Patient presents with  . Abscess    The history is provided by the patient. No language interpreter was used.   HPI Comments:  Dan [name] is a 36 y.o. male who presents to the Emergency Department complaining of a painful area of redness and swelling to the left neck that initially started as a small bump 4 days ago but has worsened in the last 24 hours. He admits to scratching at the site. Denies fever, difficulty breathing/swallowing, CP, nausea, vomiting and diarrhea. He also No h/o IVDA No alleviating factors noted; no treatments tried PTA.   Past Medical History:  Diagnosis Date  . Depression     Patient Active Problem List   Diagnosis Date Noted  . Bell's palsy 05/12/2016  . Migraine 05/12/2016    Past Surgical History:  Procedure Laterality Date  . BACK SURGERY         Home Medications    Prior to Admission medications   Medication Sig Start Date End Date Taking? Authorizing Provider  acetaminophen (TYLENOL) 500 MG tablet Take 500 mg by mouth every 6 (six) hours as needed.    Historical Provider, MD  artificial tears (LACRILUBE) OINT ophthalmic ointment Place into the left eye every 4 (four) hours as needed for dry eyes. 05/01/16   Zadie Rhineonald Wickline, MD  ibuprofen (ADVIL,MOTRIN) 200 MG tablet Take 400 mg by mouth every 6 (six) hours as needed.    Historical Provider, MD  meclizine (ANTIVERT) 25 MG tablet Take 1 tablet (25 mg total) by mouth 3 (three) times daily as needed for dizziness. 05/10/16   Anson FretAntonia B Ahern, MD  ondansetron (ZOFRAN ODT) 4 MG disintegrating tablet Take 1 tablet (4 mg total) by mouth every 8  (eight) hours as needed for nausea or vomiting. 05/10/16   Anson FretAntonia B Ahern, MD    Family History Family History  Problem Relation Age of Onset  . Diabetes Maternal Grandmother   . COPD Maternal Grandmother     Social History Social History  Substance Use Topics  . Smoking status: Current Every Day Smoker    Packs/day: 0.50  . Smokeless tobacco: Current User  . Alcohol use Yes     Comment: rare     Allergies   Patient has no known allergies.   Review of Systems Review of Systems 10 systems reviewed and all are negative for acute change except as noted in the HPI.  Physical Exam Updated Vital Signs BP 134/96   Pulse 63   Temp 98.4 F (36.9 C) (Oral)   Resp 18   SpO2 96%   Physical Exam  Constitutional: He is oriented to person, place, and time. Vital signs are normal. He appears well-developed and well-nourished. No distress.  HENT:  Head: Normocephalic and atraumatic.  Right Ear: Hearing normal.  Left Ear: Hearing normal.  Pt phonating well; able to tolerate secretions. No tracheal deviation   Eyes: Conjunctivae and EOM are normal. Pupils are equal, round, and reactive to light.  Neck: No tracheal deviation present.  Cardiovascular: Normal rate, regular rhythm, normal heart sounds and intact distal pulses.   Pulmonary/Chest: Effort normal and breath sounds normal.  Abdominal: Soft. He  exhibits no distension.  Neurological: He is alert and oriented to person, place, and time.  Skin: Skin is warm and dry.  Left anterolateral neck with a single pustule noted' there is surrounding induration 4cmx2cm spreading erythema; no purulence   Psychiatric: He has a normal mood and affect. His speech is normal and behavior is normal. Thought content normal.  Nursing note and vitals reviewed.  ED Treatments / Results  DIAGNOSTIC STUDIES:  Oxygen Saturation is 96% on RA, normal by my interpretation.    COORDINATION OF CARE:  10:20 AM Discussed treatment plan with pt at  bedside and pt agreed to plan.  Labs (all labs ordered are listed, but only abnormal results are displayed) Labs Reviewed  CBC - Abnormal; Notable for the following:       Result Value   WBC 15.0 (*)    All other components within normal limits  BASIC METABOLIC PANEL - Abnormal; Notable for the following:    Glucose, Bld 116 (*)    All other components within normal limits    EKG  EKG Interpretation None      Radiology Ct Soft Tissue Neck W Contrast  Result Date: 12/18/2016 CLINICAL DATA:  Neck abscess EXAM: CT NECK WITH CONTRAST TECHNIQUE: Multidetector CT imaging of the neck was performed using the standard protocol following the bolus administration of intravenous contrast. CONTRAST:  75mL ISOVUE-300 IOPAMIDOL (ISOVUE-300) INJECTION 61% COMPARISON:  None. FINDINGS: Extensive superficial edema in the ventral left more than right lateral neck, centered on the reported primary "Lump" in the left the midline ventral neck. Enhancement of low-density is somewhat discrete in this region without a drainable collection. On image 3:83 there is question of fistulous type structure weipointed ghted deep, but this is more likely due to neighboring vessel. There is no history of recurrent neck infection. No soft tissue emphysema or opaque foreign body. Pharynx and larynx: Normal. No mass or swelling. Salivary glands: Negative Thyroid: Negative Lymph nodes: None notably enlarged or suppurative Vascular: Negative for venous thrombosis. Anterior jugular vein partial effacement due to edema. Limited intracranial: Negative Visualized orbits: Negative Mastoids and visualized paranasal sinuses: Clear Skeleton: No acute or aggressive process. Upper chest: Negative. IMPRESSION: Extensive cellulitis of the ventral neck. No soft tissue emphysema or airway involvement. There is phlegmonous changes along the left ventral neck, no mature/drainable collection. Electronically Signed   By: Marnee Spring M.D.   On:  12/18/2016 12:18    Procedures Procedures (including critical care time)  Medications Ordered in ED Medications  iopamidol (ISOVUE-300) 61 % injection (not administered)  morphine 4 MG/ML injection 4 mg (4 mg Intravenous Given 12/18/16 1037)  iopamidol (ISOVUE-300) 61 % injection 75 mL (75 mLs Intravenous Contrast Given 12/18/16 1145)  clindamycin (CLEOCIN) IVPB 600 mg (0 mg Intravenous Stopped 12/18/16 1504)  morphine 4 MG/ML injection 4 mg (4 mg Intravenous Given 12/18/16 1252)    Initial Impression / Assessment and Plan / ED Course  I have reviewed the triage vital signs and the nursing notes.  Pertinent labs & imaging results that were available during my care of the patient were reviewed by me and considered in my medical decision making (see chart for details).  Final Clinical Impressions(s) / ED Diagnoses  {I have reviewed and evaluated the relevant laboratory values. {I have reviewed and evaluated the relevant imaging studies.  {I have reviewed the relevant previous healthcare records.  {I obtained HPI from historian. {Patient discussed with supervising physician.  ED Course:  Assessment: Pt is a  36 y.o. male who presents with possible neck abscess noted on Thursday. States redness and swelling in past 24 hours. Possible insect bite initially that he was scratching. No fevers. Mild pain with PO intake. Pain with ROM of neck On exam, pt in NAD. Nontoxic/nonseptic appearing. VSS. Afebrile. Lungs CTA. Heart RRR. Abdomen nontender soft. Area of concern with spreading erythema. Pustule noted with induration. CBC with leukocytosis of 15. BMP unremarkable.  CT Soft Tissue neck  showed no drainable abscess. No airway involvement. Noted extensive cellulitis in venrtal neck. Discussed with supervising physician. Given analgesia and clindamycin IV in ED. Plan is to DC home with close follow up to PCP for wound check in 48 hours. Give nRx Clindamycin. At time of discharge, Patient is in no acute  distress. Vital Signs are stable. Patient is able to ambulate. Patient able to tolerate PO.   Disposition/Plan:  DC Home Additional Verbal discharge instructions given and discussed with patient.  Pt Instructed to f/u with PCP in the next week for evaluation and treatment of symptoms. Return precautions given Pt acknowledges and agrees with plan  Supervising Physician Lavera Guise, MD  Final diagnoses:  Cellulitis of neck    New Prescriptions New Prescriptions   No medications on file   I personally performed the services described in this documentation, which was scribed in my presence. The recorded information has been reviewed and is accurate.     Audry Pili, PA-C 12/18/16 1704    Lavera Guise, MD 12/18/16 1944

## 2016-12-18 NOTE — ED Triage Notes (Signed)
Pt presents to ER with c/o abscess to left side of neck x 1 week. Within last two hours pt reports swelling and redness has started spreading to middle of his neck as well as chest. He reports chills and subjective fever with this. Pain is 10/10

## 2016-12-18 NOTE — ED Provider Notes (Signed)
Medical screening examination/treatment/procedure(s) were conducted as a shared visit with non-physician practitioner(s) and myself.  I personally evaluated the patient during the encounter.   EKG Interpretation None      36 year old male, otherwise healthy, who presents with redness and swelling to the left neck. States that a few days ago she noticed an ingrown hair from shaving. Over the course of the past day has had increased redness since swelling to the area. No fevers or chills, drainage, difficulty breathing, difficulty swallowing saliva. The patient nontoxic in no acute distress with normal vital signs. There is area of erythema, induration, warmth over the line the left neck that stems from ingrown hair. CT soft tissue of the neck is visualized and does not show abscess or fluid collection. Presentation concerning for cellulitis. Will start on oral antibiotics. Discussed strict return instructions.   Lavera Guiseana Duo Artia Singley, MD 12/18/16 [phone]

## 2016-12-18 NOTE — Discharge Instructions (Signed)
Please read and follow all provided instructions.  Your diagnoses today include:  1. Cellulitis of neck     Tests performed today include: Vital signs. See below for your results today.   Medications prescribed:   Take any prescribed medications only as directed.   Home care instructions:  Follow any educational materials contained in this packet. Keep affected area above the level of your heart when possible. Wash area gently twice a day with warm soapy water. Do not apply alcohol or hydrogen peroxide. Cover the area if it draining or weeping.   Follow-up instructions: Return to the Emergency Department in 48 hours for a recheck if your symptoms are not significantly improved.   Please follow-up with your primary care provider in the next 1 week for further evaluation of your symptoms.   Return instructions:  Return to the Emergency Department if you have: Fever Worsening symptoms Worsening pain Worsening swelling Redness of the skin that moves away from the affected area, especially if it streaks away from the affected area  Any other emergent concerns  Your vital signs today were: BP 134/96    Pulse 63    Temp 98.4 F (36.9 C) (Oral)    Resp 18    SpO2 96%  If your blood pressure (BP) was elevated above 135/85 this visit, please have this repeated by your doctor within one month. --------------

## 2017-07-25 IMAGING — CT CT NECK W/ CM
4 of 5 series · 15 of 33 positions shown, 17 images · IV contrast (ISOVUE)
Comparison: None.

CLINICAL DATA: Neck abscess

EXAM:
CT NECK WITH CONTRAST
TECHNIQUE: Multidetector CT imaging of the neck was performed using the
standard protocol following the bolus administration of intravenous
contrast.
CONTRAST:  75mL EDN9P6-FCC IOPAMIDOL (EDN9P6-FCC) INJECTION 61%

[Series 3: neck with st · axial · 0.46mm/px · z∈[-242,-98]mm · 3 of 144 slices shown]
[im 36/144  bone]
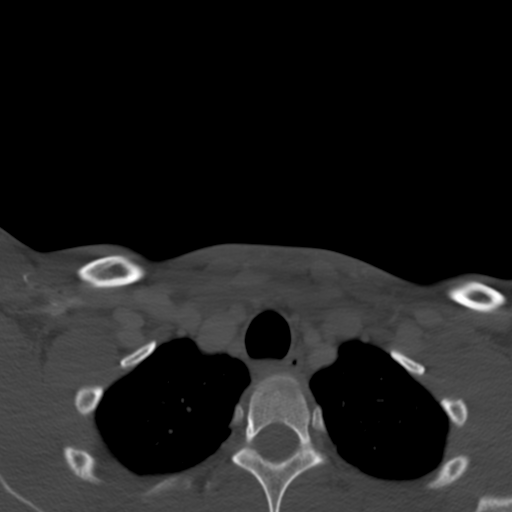
[im 72/144  bone]
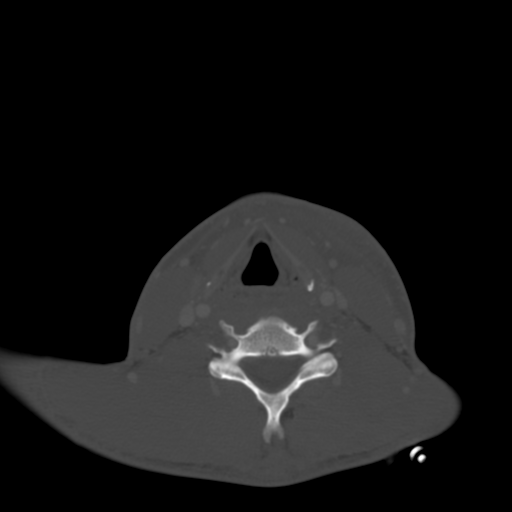
[im 108/144  bone]
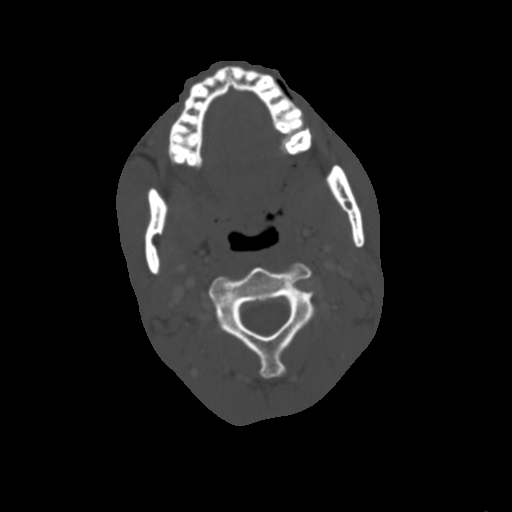

[Series 7: coronal st · coronal · 0.53mm/px · 3 of 127 slices shown]
[im 26/127  bone]
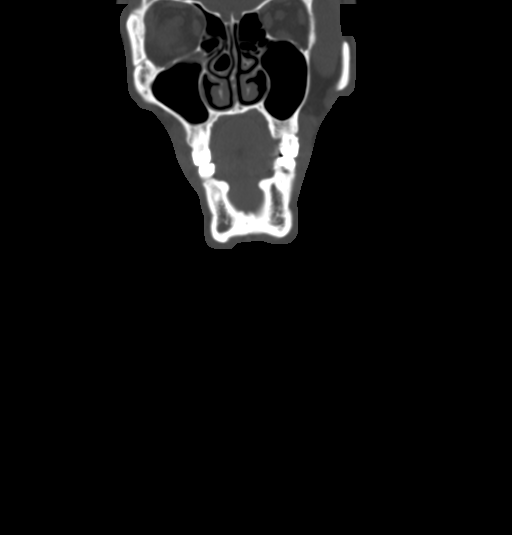
[im 51/127  bone]
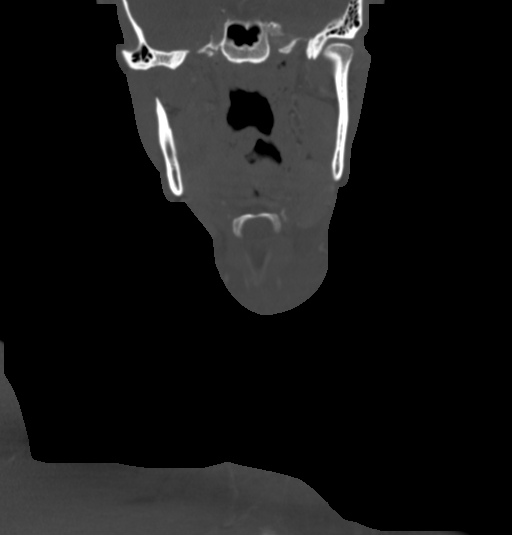
[im 76/127  bone]
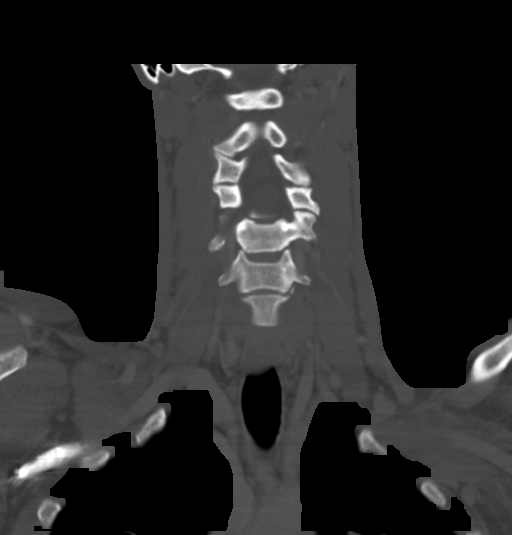

[Series 8: sagittal st · sagittal · 0.57mm/px · 5 of 119 slices shown, 6 images]
[im 40/119  bone]
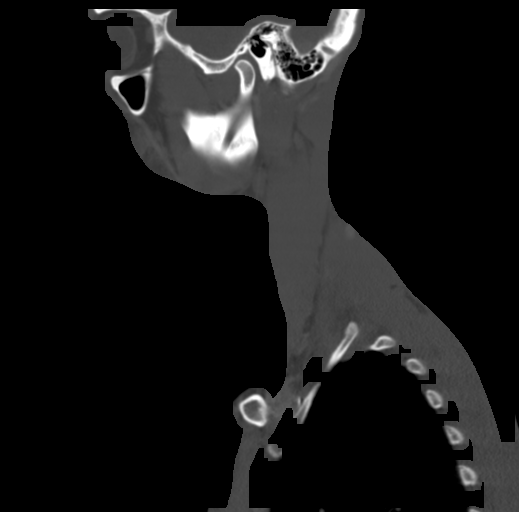
[im 50/119  bone]
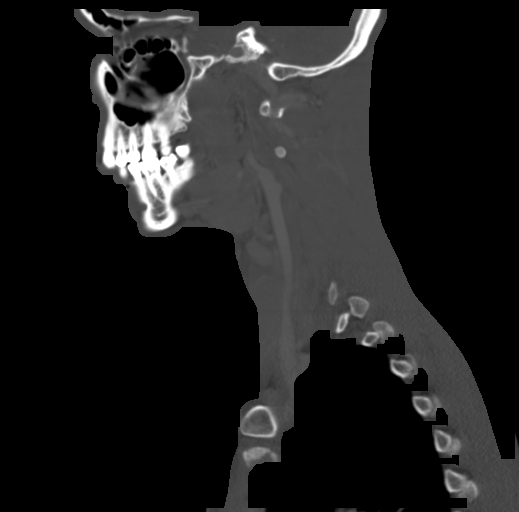
[im 60/119  soft-tissue]
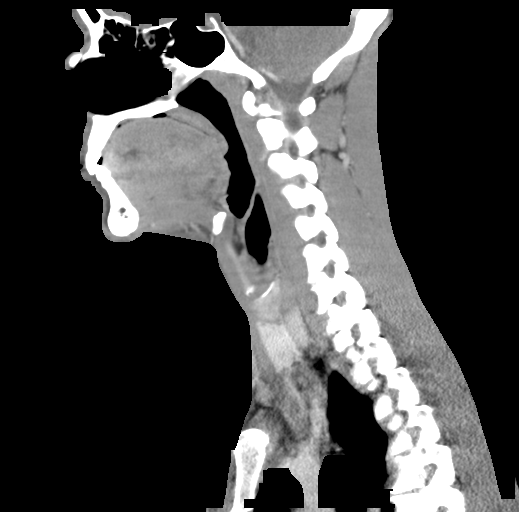
[im 60/119  bone]
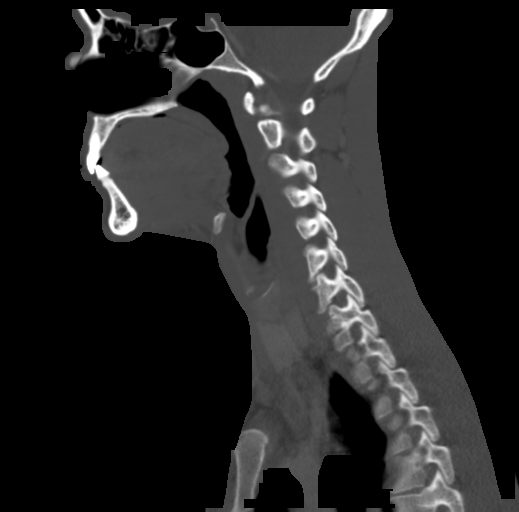
[im 69/119  bone]
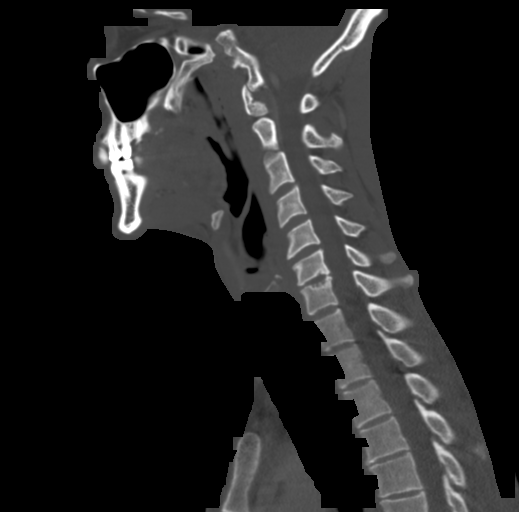
[im 79/119  bone]
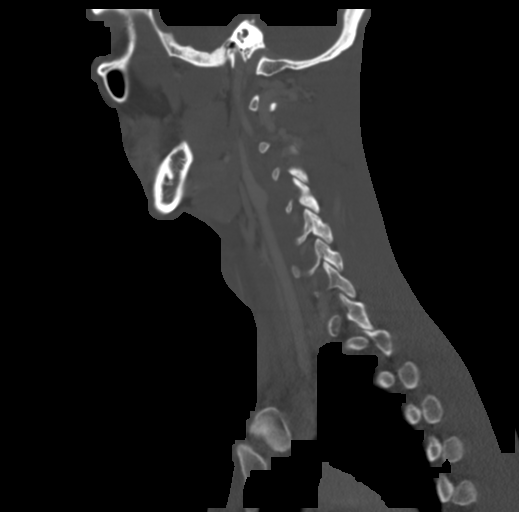

[Series 9: ax axial recons · axial · 0.39mm/px · z∈[-263,-94]mm · 4 of 143 slices shown, 5 images]
[im 29/143  soft-tissue]
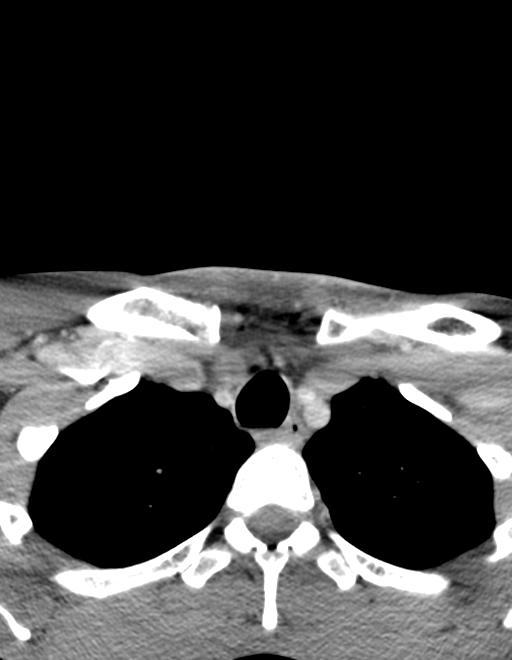
[im 29/143  bone]
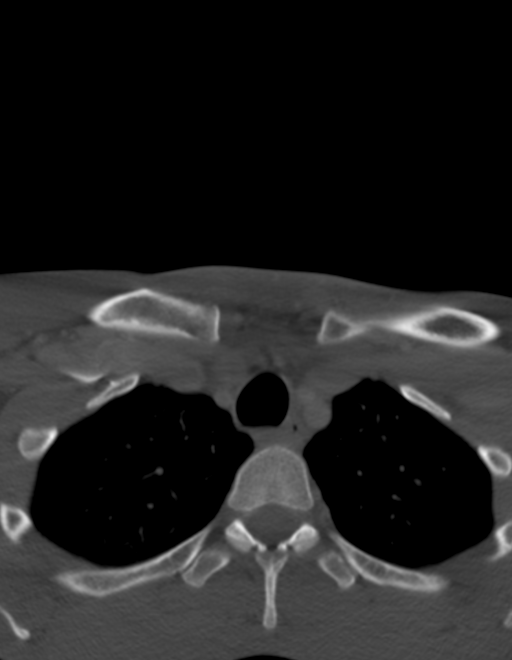
[im 57/143  bone]
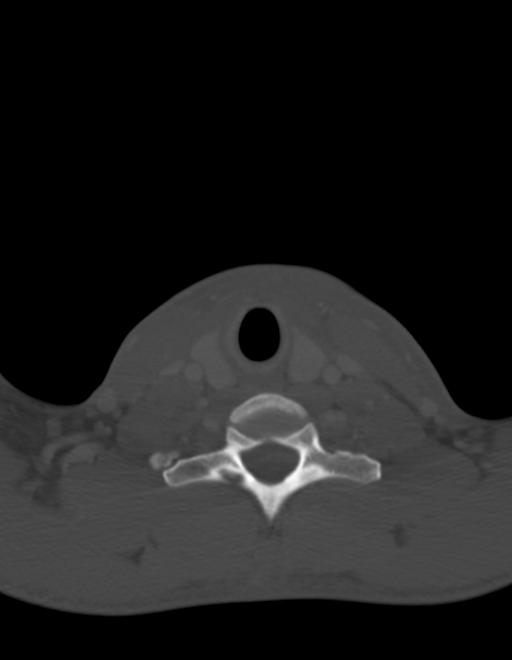
[im 86/143  bone]
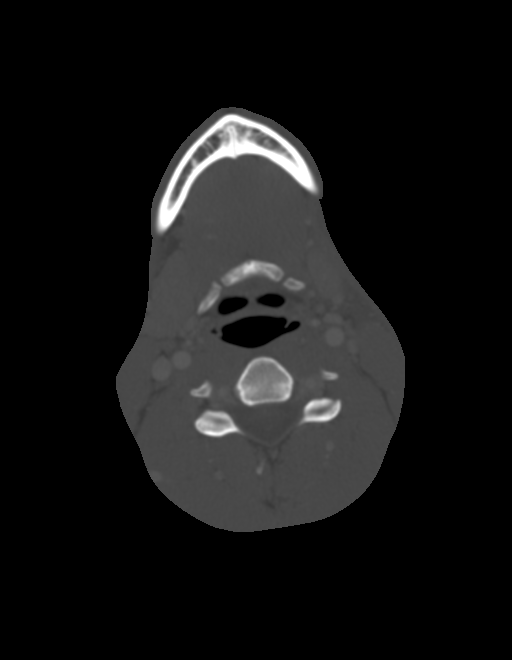
[im 114/143  bone]
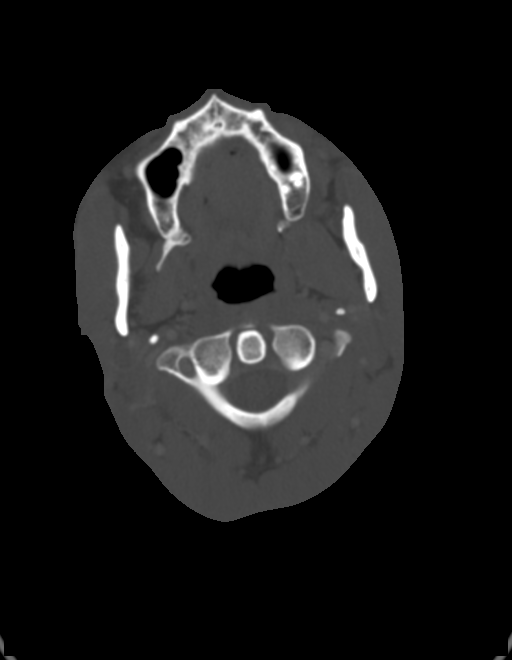

[15 of 33 positions shown; findings below may reference images not displayed]

FINDINGS: Extensive superficial edema in the ventral left more than right
lateral neck, centered on the reported primary "Lump" in the left
the midline ventral neck. Enhancement of low-density is somewhat
discrete in this region without a drainable collection. On image
[DATE] there is question of fistulous type structure weipointed ghted
deep, but this is more likely due to neighboring vessel. There is no
history of recurrent neck infection. No soft tissue emphysema or
opaque foreign body.

Pharynx and larynx: Normal. No mass or swelling.

Salivary glands: Negative

Thyroid: Negative

Lymph nodes: None notably enlarged or suppurative

Vascular: Negative for venous thrombosis. Anterior jugular vein
partial effacement due to edema.

Limited intracranial: Negative

Visualized orbits: Negative

Mastoids and visualized paranasal sinuses: Clear

Skeleton: No acute or aggressive process.

Upper chest: Negative.
IMPRESSION: Extensive cellulitis of the ventral neck. No soft tissue emphysema
or airway involvement. There is phlegmonous changes along the left
ventral neck, no mature/drainable collection.
